# Patient Record
Sex: Female | Born: 1995 | Race: White | Hispanic: No | Marital: Single | Smoking: Never smoker
Health system: Southern US, Community
[De-identification: ages and names within clinical notes are randomized; demographics above are authoritative.]

---
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
## 2017-04-29 ENCOUNTER — Other Ambulatory Visit: Payer: Self-pay | Admitting: Obstetrics and Gynecology

## 2017-05-16 ENCOUNTER — Telehealth: Payer: Self-pay | Admitting: Obstetrics and Gynecology

## 2017-05-16 ENCOUNTER — Other Ambulatory Visit: Payer: Self-pay | Admitting: Obstetrics and Gynecology

## 2017-05-16 NOTE — Telephone Encounter (Signed)
Patient called stating she lost her Sprintec pack and is on the second week. She is requesting a 1 time refill. Please advise.

## 2017-05-16 NOTE — Telephone Encounter (Signed)
Pt called stating that she has called her pharmacy for a refill of her BC, but pt states that [facility] and would like to speak with a nurse and also would like a refill. Please contact pt

## 2017-05-18 MED ORDER — NORGESTIMATE-ETH ESTRADIOL 0.25-35 MG-MCG PO TABS: 1.0000 | ORAL_TABLET | Freq: Every day | ORAL | 11 refills | Status: DC

## 2017-05-18 NOTE — Telephone Encounter (Signed)
Refilled sprintec x 11 mos

## 2017-05-18 NOTE — Addendum Note (Signed)
Addended by: Tilda BurrowFERGUSON, Naz Denunzio V on: 05/18/2017 10:23 PM   Modules accepted: Orders

## 2017-05-28 ENCOUNTER — Other Ambulatory Visit: Payer: Self-pay | Admitting: Obstetrics & Gynecology

## 2017-07-21 ENCOUNTER — Encounter: Payer: Self-pay | Admitting: Women's Health

## 2017-07-21 ENCOUNTER — Encounter (INDEPENDENT_AMBULATORY_CARE_PROVIDER_SITE_OTHER): Payer: Self-pay

## 2017-07-21 ENCOUNTER — Ambulatory Visit: Payer: BC Managed Care – PPO | Admitting: Women's Health

## 2017-07-21 VITALS — BP 120/70 | HR 90 | Ht 65.0 in | Wt 134.0 lb

## 2017-07-21 DIAGNOSIS — B009 Herpesviral infection, unspecified: Secondary | ICD-10-CM

## 2017-07-21 DIAGNOSIS — Z113 Encounter for screening for infections with a predominantly sexual mode of transmission: Secondary | ICD-10-CM

## 2017-07-21 MED ORDER — VALACYCLOVIR HCL 1 G PO TABS: 1000.0000 mg | ORAL_TABLET | Freq: Three times a day (TID) | ORAL | 0 refills | Status: AC

## 2017-07-21 NOTE — Progress Notes (Addendum)
   [facility] Visit  Patient name: Tina Morrow MRN [id number]  Date of birth: January 01, 1996 CC & HPI:  Tina Morrow is a 21 y.o. G0P0000 Caucasian female being seen today for f/u vaginitis dx at urgent care/ED. She is accompanied by her mother who is a Charity fundraiser. Is in college in [place], went to [place] about 9days ago, was dx w/ genital warts, was given Imiquimod cream, used it once and no problems, used it the 2nd time and had itching/burning/irritation, so went to urgent care and they thought she may have yeast infection from the cream, so was given yeast cream which made it worse. Went to ED b/c vulva was very swollen, painful, draining discharge, she had a fever- they thought she may be having a reaction to the different creams so she was given a steroid cream which also made it worse. Was told to do epsom salt sitz baths, TUCKS pads, which haven't helped. Was given rx for Vicodin, has taken 3, helps some. Is miserable, wakes her up from sleep. Is able to urinate without problems.  Only 1 sexual partner for about 1 year, uses condoms every time. Was checked for gc/ct earlier in the year and was neg.   Patient's last menstrual period was 06/23/2017. The current method of family planning is OCP (estrogen/progesterone). Last pap <21yo  Review of Systems:   Patient denies any headaches, hearing loss, fatigue, blurred vision, shortness of breath, chest pain, abdominal pain, problems with bowel movements, urination, or intercourse. No joint pain or mood swings.  Pertinent History Reviewed:  Reviewed past medical,surgical and family history.  Reviewed problem list, medications and allergies.  Objective Findings:   Vitals:   07/21/17 1421  BP: 120/70  Pulse: 90  Weight: 134 lb (60.8 kg)  Height:  (1.651 m)    Body mass index is 22.3 kg/m.  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 If runs out of vicodin (has 9 left out of 12 that were dispensed) can let us know. School note given to be excused until 07/29/17. Will send urine for gc/ct  Return in about 1 week (around 07/28/2017) for F/U w/ Dr. Despina Hidden.  Marge Duncans CNM, [facility] 07/21/2017 5:06 PM

## 2017-07-21 NOTE — Addendum Note (Signed)
Addended by: Cheral Marker on: 07/21/2017 05:06 PM   Modules accepted: Orders

## 2017-07-21 NOTE — Patient Instructions (Signed)
Genital Herpes Genital herpes is a common sexually transmitted infection (STI) that is caused by a virus. The virus spreads from person to person through sexual contact. Infection can cause itching, blisters, and sores around the genitals or rectum. Symptoms may last several days and then go away This is called an outbreak. However, the virus remains in your body, so you may have more outbreaks in the future. The time between outbreaks varies and can be months or years. Genital herpes affects men and women. It is particularly concerning for pregnant women because the virus can be passed to the baby during delivery and can cause serious problems. Genital herpes is also a concern for people who have a weak disease-fighting (immune) system. What are the causes? This condition is caused by the herpes simplex virus (HSV) type 1 or type 2. The virus may spread through:  Sexual contact with an infected person, including vaginal, anal, and oral sex.  Contact with fluid from a herpes sore.  The skin. This means that you can get herpes from an infected partner even if he or she does not have a visible sore or does not know that he or she is infected. What increases the risk? You are more likely to develop this condition if:  You have sex with many partners.  You do not use latex condoms during sex. What are the signs or symptoms? Most people do not have symptoms (asymptomatic) or have mild symptoms that may be mistaken for other skin problems. Symptoms may include:  Small red bumps near the genitals, rectum, or mouth. These bumps turn into blisters and then turn into sores.  Flu-like symptoms, including:  Fever.  Body aches.  Swollen lymph nodes.  Headache.  Painful urination.  Pain and itching in the genital area or rectal area.  Vaginal discharge.  Tingling or shooting pain in the legs and buttocks. Generally, symptoms are more severe and last longer during the first (primary)  outbreak. Flu-like symptoms are also more common during the primary outbreak. How is this diagnosed? Genital herpes may be diagnosed based on:  A physical exam.  Your medical history.  Blood tests.  A test of a fluid sample (culture) from an open sore. How is this treated? There is no cure for this condition, but treatment with antiviral medicines that are taken by mouth (orally) can do the following:  Speed up healing and relieve symptoms.  Help to reduce the spread of the virus to sexual partners.  Limit the chance of future outbreaks, or make future outbreaks shorter.  Lessen symptoms of future outbreaks. Your health care provider may also recommend pain relief medicines, such as aspirin or ibuprofen. Follow these instructions at home: Sexual activity   Do not have sexual contact during active outbreaks.  Practice safe sex. Latex condoms and female condoms may help prevent the spread of the herpes virus. General instructions   Keep the affected areas dry and clean.  Take over-the-counter and prescription medicines only as told by your health care provider.  Avoid rubbing or touching blisters and sores. If you do touch blisters or sores:  Wash your hands thoroughly with soap and water.  Do not touch your eyes afterward.  To help relieve pain or itching, you may take the following actions as directed by your health care provider:  Apply a cold, wet cloth (cold compress) to affected areas 4-6 times a day.  Apply a substance that protects your skin and reduces bleeding (astringent).  Apply a   gel that helps relieve pain around sores (lidocaine gel).  Take a warm, shallow bath that cleans the genital area (sitz bath).  Keep all follow-up visits as told by your health care provider. This is important. How is this prevented?  Use condoms. Although anyone can get genital herpes during sexual contact, even with the use of a condom, a condom can provide some  protection.  Avoid having multiple sexual partners.  Talk with your sexual partner about any symptoms either of you may have. Also, talk with your partner about any history of STIs.  Get tested for STIs before you have sex. Ask your partner to do the same.  Do not have sexual contact if you have symptoms of genital herpes. Contact a health care provider if:  Your symptoms are not improving with medicine.  Your symptoms return.  You have new symptoms.  You have a fever.  You have abdominal pain.  You have redness, swelling, or pain in your eye.  You notice new sores on other parts of your body.  You are a woman and experience bleeding between menstrual periods.  You have had herpes and you become pregnant or plan to become pregnant. Summary  Genital herpes is a common sexually transmitted infection (STI) that is caused by the herpes simplex virus (HSV) type 1 or type 2.  These viruses are most often spread through sexual contact with an infected person.  You are more likely to develop this condition if you have sex with many partners or you have unprotected sex.  Most people do not have symptoms (asymptomatic) or have mild symptoms that may be mistaken for other skin problems. Symptoms occur as outbreaks that may happen months or years apart.  There is no cure for this condition, but treatment with oral antiviral medicines can reduce symptoms, reduce the chance of spreading the virus to a partner, prevent future outbreaks, or shorten future outbreaks. This information is not intended to replace advice given to you by your health care provider. Make sure you discuss any questions you have with your health care provider. Document Released: 10/11/2000 Document Revised: 09/13/2016 Document Reviewed: 09/13/2016 Elsevier Interactive Patient Education  2017 Elsevier Inc.  

## 2017-07-22 ENCOUNTER — Telehealth: Payer: Self-pay | Admitting: *Deleted

## 2017-07-22 NOTE — Telephone Encounter (Signed)
Honestly no oral  pain meds are really going to help until the infection starts to improve Also I don't want to use anything topically which has the potential to be confusing to the how the patient is responding She can try cool compresses or ice pak over her underwear and see if this will help

## 2017-07-22 NOTE — Telephone Encounter (Signed)
Spoke with pt's mom giving Dr. Forestine Chute recommendations. Pt's mom voiced understanding. JSY

## 2017-07-23 LAB — GC/CHLAMYDIA PROBE AMP
Chlamydia trachomatis, NAA: NEGATIVE
Neisseria gonorrhoeae by PCR: NEGATIVE

## 2017-07-24 ENCOUNTER — Telehealth: Payer: Self-pay | Admitting: *Deleted

## 2017-07-24 MED ORDER — HYDROCODONE-ACETAMINOPHEN 5-325 MG PO TABS: 1.0000 | ORAL_TABLET | ORAL | 0 refills | Status: AC | PRN

## 2017-07-24 NOTE — Telephone Encounter (Signed)
Pt's mom aware prescription is at front desk. JSY

## 2017-07-29 ENCOUNTER — Encounter: Payer: Self-pay | Admitting: Obstetrics & Gynecology

## 2017-07-29 ENCOUNTER — Ambulatory Visit (INDEPENDENT_AMBULATORY_CARE_PROVIDER_SITE_OTHER): Payer: BC Managed Care – PPO | Admitting: Obstetrics & Gynecology

## 2017-07-29 VITALS — BP 100/60 | Ht 65.0 in | Wt 132.0 lb

## 2017-07-29 DIAGNOSIS — B009 Herpesviral infection, unspecified: Secondary | ICD-10-CM

## 2017-07-29 NOTE — Progress Notes (Signed)
Chief Complaint  Patient presents with  . Follow-up    hsv outbreak    Blood pressure 100/60, height  (1.651 m), weight 132 lb (59.9 kg).  21 y.o. G0P0000 No LMP recorded. The current method of family planning is OCP (estrogen/progesterone).  Outpatient Encounter Prescriptions as of 07/29/2017  Medication Sig  . norgestimate-ethinyl estradiol (SPRINTEC 28) 0.25-35 MG-MCG tablet Take 1 tablet by mouth daily.  . valACYclovir (VALTREX) 1000 MG tablet Take 1 tablet (1,000 mg total) by mouth 3 (three) times daily.  Marland Kitchen desonide (DESOWEN) 0.05 % cream APPLY SPARINGLY TO THE AFFECTED AREA TWICE DAILY FOR 10 DAYS MAX  . fluconazole (DIFLUCAN) 150 MG tablet Take 150 mg by mouth See admin instructions.  Marland Kitchen HYDROcodone-acetaminophen (NORCO/VICODIN) 5-325 MG tablet Take 1 tablet by mouth every 4 (four) hours as needed for severe pain. (Patient not taking: Reported on 07/29/2017)  . imiquimod (ALDARA) 5 % cream APPLY TO THE AFFECTED AREA BY TOPICAL ROUTE 3 TIMES PER WEEK AT BEDTIME. MAX 16 WEEKS   No facility-administered encounter medications on file as of 07/29/2017.     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 which is also better now  She feels dramatically better this week than she did last week  Objective On exam it is hard to describe how much better her vulva looks Honestly it's hard to believe this is same person There is minimal inflammation no ulcerative tissue He still does have lymphadenopathy right greater than left which is minimally tender At this point I can't really say anything about the presence of warts may be one perirectal but really that's not a significant issue I think may be a skin tag  Pertinent ROS No burning with urination,  frequency or urgency No nausea, vomiting or diarrhea Nor fever chills or other constitutional symptoms   Labs or studies No new studies    Impression Diagnoses this Encounter::   ICD-10-CM   1. 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   Labs or Scans Ordered: No orders of the defined types were placed in this encounter.   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 We discussed at length the possibility of going on chronic suppression over this next year in that in the first year there is an average of 6 outbreaks versus 2 Pan suppression She is been warned against handing I've also instructed her not to use anything topically creams or lotions or anything for this next month in order to not add any other variables to the equation We also talked at length regarding a future pregnancy and how this would be related to vaginal delivery versus cesarean section if she has an active outbreak at the time of labor or ruptured membranes All questions were answered  Follow up Return in about 1 month (around 08/29/2017) for Follow up.        Face to face time:  15 minutes  Greater than 50% of the visit time was spent in counseling and coordination of care with the patient.  The summary and  outline of the counseling and care coordination is summarized in the note above.   All questions were answered.  No past medical history on file.  No past surgical history on file.  OB History    Gravida Para Term Preterm AB Living   0 0 0 0 0 0   SAB TAB Ectopic Multiple Live Births   0 0 0 0 0      No Known Allergies  Social History   Social History  . Marital status: Single    Spouse name: N/A  . Number of children: N/A  . Years of education: N/A    Social History Main Topics  . Smoking status: Never Smoker  . Smokeless tobacco: Never Used  . Alcohol use No  . Drug use: No  . Sexual activity: Yes    Birth control/ protection: Pill   Other Topics Concern  . Not on file   Social History Narrative  . No narrative on file    Family History  Problem Relation Age of Onset  . Hypertension Maternal Grandmother

## 2017-09-01 ENCOUNTER — Ambulatory Visit: Payer: BC Managed Care – PPO | Admitting: Obstetrics & Gynecology

## 2017-09-01 ENCOUNTER — Encounter: Payer: Self-pay | Admitting: Obstetrics & Gynecology

## 2017-09-01 VITALS — BP 110/60 | HR 80 | Wt 136.0 lb

## 2017-09-01 DIAGNOSIS — B009 Herpesviral infection, unspecified: Secondary | ICD-10-CM

## 2017-09-01 DIAGNOSIS — A630 Anogenital (venereal) warts: Secondary | ICD-10-CM

## 2017-09-01 MED ORDER — TRICHLOROACETIC ACID 80 % EX LIQD: 1.0000 "application " | Freq: Once | CUTANEOUS | 0 refills | Status: AC

## 2017-09-01 NOTE — Progress Notes (Signed)
Chief Complaint  Patient presents with  . Follow-up      21 y.o. G0P0000 Patient's last menstrual period was 08/18/2017. The current method of family planning is OCP (estrogen/progesterone).  Outpatient Encounter Medications as of 09/01/2017  Medication Sig  . norgestimate-ethinyl estradiol (SPRINTEC 28) 0.25-35 MG-MCG tablet Take 1 tablet by mouth daily.  Marland Kitchen desonide (DESOWEN) 0.05 % cream APPLY SPARINGLY TO THE AFFECTED AREA TWICE DAILY FOR 10 DAYS MAX  . fluconazole (DIFLUCAN) 150 MG tablet Take 150 mg by mouth See admin instructions.  Marland Kitchen HYDROcodone-acetaminophen (NORCO/VICODIN) 5-325 MG tablet Take 1 tablet by mouth every 4 (four) hours as needed for severe pain. (Patient not taking: Reported on 07/29/2017)  . imiquimod (ALDARA) 5 % cream APPLY TO THE AFFECTED AREA BY TOPICAL ROUTE 3 TIMES PER WEEK AT BEDTIME. MAX 16 WEEKS  . trichloroacetic acid 80 % LIQD Apply 1 application once for 1 dose topically.  . valACYclovir (VALTREX) 1000 MG tablet Take 1 tablet (1,000 mg total) by mouth 3 (three) times daily. (Patient not taking: Reported on 09/01/2017)   No facility-administered encounter medications on file as of 09/01/2017.     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 No pertinent past medical history.  History reviewed. No pertinent surgical history.  OB History    Gravida Para Term Preterm AB Living   0 0 0 0 0 0   SAB TAB Ectopic Multiple Live Births   0 0 0 0 0      No Known Allergies  Social History   Socioeconomic History  . Marital status: Single    Spouse name: None  . Number of children: None  . Years of education: None  . Highest education level: None  Social Needs  . Financial resource strain: None  . Food insecurity - worry: None  . Food insecurity - inability: None  . Transportation needs - medical: None  . Transportation needs - non-medical: None  Occupational History  . None  Tobacco Use  . Smoking status: Never Smoker  . Smokeless tobacco: Never Used  Substance and Sexual Activity  . Alcohol use: No  . Drug use: No  . Sexual activity: Yes    Birth control/protection: Pill  Other Topics Concern  . None  Social History Narrative  . None    Family History  Problem Relation Age of Onset  . Hypertension Maternal Grandmother     Medications:       Current Outpatient Medications:  .  norgestimate-ethinyl estradiol (SPRINTEC 28) 0.25-35 MG-MCG tablet, Take 1 tablet by mouth daily., Disp: 28 tablet, Rfl: 11 .  desonide (DESOWEN) 0.05 % cream, APPLY SPARINGLY TO THE AFFECTED AREA TWICE DAILY FOR 10 DAYS MAX, Disp: , Rfl: 0 .  fluconazole (DIFLUCAN) 150 MG tablet, Take 150 mg by mouth See admin instructions., Disp: , Rfl: 1 .  HYDROcodone-acetaminophen (NORCO/VICODIN) 5-325 MG tablet, Take 1 tablet by mouth every 4 (four) hours as needed for severe pain. (Patient not taking: Reported on 07/29/2017), Disp: 10 tablet, Rfl: 0 .  imiquimod (ALDARA) 5 % cream, APPLY TO THE AFFECTED AREA BY TOPICAL ROUTE 3 TIMES PER WEEK AT BEDTIME. MAX 16 WEEKS, Disp: , Rfl: 0 .  trichloroacetic acid 80 % LIQD, Apply 1 application once for 1 dose topically., Disp: 1 Bottle, Rfl: 0 .  valACYclovir (VALTREX) 1000 MG  tablet, Take 1 tablet (1,000 mg total) by mouth 3 (three) times daily. (Patient not taking: Reported on 09/01/2017), Disp: 60 tablet, Rfl: 0  Objective Blood pressure 110/60, pulse 80, weight 136 lb (61.7 kg), last menstrual period 08/18/2017.  General WDWN female NAD Vulva:  normal appearing vulva with no masses, tenderness or lesions, she does have probably about 5 or 6 miliary condyloma at the vaginal fourchette, Vagina:  normal mucosa, no discharge Cervix:   Uterus:   Adnexa: ovaries:,     Pertinent ROS No burning with urination, frequency or urgency No nausea, vomiting or diarrhea Nor fever chills or other constitutional symptoms   Labs or studies none    Impression Diagnoses this Encounter::   ICD-10-CM   1. Condyloma acuminata A63.0   2. 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 trichloroacetic acid 80 % LIQD    Sig: Apply 1 application once for 1 dose topically.    Dispense:  1 Bottle    Refill:  0    Labs or Scans Ordered: No orders of the defined types were placed in this encounter.   Management:: I have prescribed trichloroacetic acid 80% for the patient to pick up and bring back to the office Her schedule is a little bit variable right now because of school so she will figure out when she can bring it back and receive a treatment She was given Aldara to take at home but she definitely does not want to do that given what happened previously additionally we talked about treatment of Outbreaks of HSV-2, at this point she does not want to do chronic suppressive therapy  Follow up Return if symptoms worsen or fail to improve, for TCA therapy, with Dr Despina HiddenEure. Her next visit will be for TCA treatment only no office visit to be charged         All questions were answered.

## 2018-02-23 ENCOUNTER — Other Ambulatory Visit: Payer: Self-pay | Admitting: Obstetrics and Gynecology

## 2018-11-04 ENCOUNTER — Other Ambulatory Visit: Payer: Self-pay | Admitting: Obstetrics and Gynecology
